# Patient Record
Sex: Female | Born: 1935
Health system: Southern US, Community
[De-identification: ages and names within clinical notes are randomized; demographics above are authoritative.]

---

## 1998-02-10 ENCOUNTER — Ambulatory Visit (HOSPITAL_COMMUNITY): Admission: RE | Admit: 1998-02-10 | Discharge: 1998-02-10 | Payer: Self-pay | Admitting: Cardiology

## 1998-05-27 ENCOUNTER — Other Ambulatory Visit: Admission: RE | Admit: 1998-05-27 | Discharge: 1998-05-27 | Payer: Self-pay | Admitting: Obstetrics and Gynecology

## 1999-05-27 ENCOUNTER — Encounter (INDEPENDENT_AMBULATORY_CARE_PROVIDER_SITE_OTHER): Payer: Self-pay | Admitting: Specialist

## 1999-05-27 ENCOUNTER — Ambulatory Visit (HOSPITAL_COMMUNITY): Admission: RE | Admit: 1999-05-27 | Discharge: 1999-05-27 | Payer: Self-pay | Admitting: Obstetrics and Gynecology

## 1999-09-22 ENCOUNTER — Encounter: Payer: Self-pay | Admitting: Obstetrics and Gynecology

## 1999-09-29 ENCOUNTER — Encounter (INDEPENDENT_AMBULATORY_CARE_PROVIDER_SITE_OTHER): Payer: Self-pay | Admitting: Specialist

## 1999-09-29 ENCOUNTER — Inpatient Hospital Stay (HOSPITAL_COMMUNITY): Admission: RE | Admit: 1999-09-29 | Discharge: 1999-09-30 | Payer: Self-pay

## 2002-01-07 ENCOUNTER — Ambulatory Visit (HOSPITAL_COMMUNITY): Admission: RE | Admit: 2002-01-07 | Discharge: 2002-01-07 | Payer: Self-pay | Admitting: Gastroenterology

## 2004-07-18 ENCOUNTER — Encounter: Admission: RE | Admit: 2004-07-18 | Discharge: 2004-07-18 | Payer: Self-pay | Admitting: Radiology

## 2004-08-03 ENCOUNTER — Encounter: Admission: RE | Admit: 2004-08-03 | Discharge: 2004-08-03 | Payer: Self-pay

## 2004-08-04 ENCOUNTER — Ambulatory Visit (HOSPITAL_COMMUNITY): Admission: RE | Admit: 2004-08-04 | Discharge: 2004-08-04 | Payer: Self-pay

## 2004-08-04 ENCOUNTER — Encounter (INDEPENDENT_AMBULATORY_CARE_PROVIDER_SITE_OTHER): Payer: Self-pay | Admitting: Specialist

## 2004-08-04 ENCOUNTER — Encounter (INDEPENDENT_AMBULATORY_CARE_PROVIDER_SITE_OTHER): Payer: Self-pay

## 2004-08-04 ENCOUNTER — Ambulatory Visit (HOSPITAL_BASED_OUTPATIENT_CLINIC_OR_DEPARTMENT_OTHER): Admission: RE | Admit: 2004-08-04 | Discharge: 2004-08-04 | Payer: Self-pay

## 2004-08-18 ENCOUNTER — Ambulatory Visit: Payer: Self-pay | Admitting: Oncology

## 2004-09-07 ENCOUNTER — Ambulatory Visit (HOSPITAL_COMMUNITY): Admission: RE | Admit: 2004-09-07 | Discharge: 2004-09-07 | Payer: Self-pay | Admitting: Oncology

## 2004-09-16 ENCOUNTER — Ambulatory Visit: Admission: RE | Admit: 2004-09-16 | Discharge: 2004-11-17 | Payer: Self-pay | Admitting: *Deleted

## 2004-11-26 ENCOUNTER — Ambulatory Visit: Payer: Self-pay | Admitting: Oncology

## 2005-02-24 ENCOUNTER — Ambulatory Visit: Payer: Self-pay | Admitting: Oncology

## 2005-04-29 ENCOUNTER — Ambulatory Visit: Payer: Self-pay | Admitting: Oncology

## 2005-06-28 ENCOUNTER — Ambulatory Visit: Payer: Self-pay | Admitting: Oncology

## 2005-07-07 ENCOUNTER — Ambulatory Visit (HOSPITAL_COMMUNITY): Admission: RE | Admit: 2005-07-07 | Discharge: 2005-07-07 | Payer: Self-pay | Admitting: Oncology

## 2005-11-16 ENCOUNTER — Ambulatory Visit: Payer: Self-pay | Admitting: Oncology

## 2005-11-18 LAB — CBC WITH DIFFERENTIAL/PLATELET
Basophils Absolute: 0 10*3/uL (ref 0.0–0.1)
EOS%: 2.6 % (ref 0.0–7.0)
Eosinophils Absolute: 0.2 10*3/uL (ref 0.0–0.5)
HCT: 37.8 % (ref 34.8–46.6)
HGB: 12.7 g/dL (ref 11.6–15.9)
MCH: 27.7 pg (ref 26.0–34.0)
MCV: 82.5 fL (ref 81.0–101.0)
MONO%: 7.9 % (ref 0.0–13.0)
NEUT#: 5.1 10*3/uL (ref 1.5–6.5)
NEUT%: 68.9 % (ref 39.6–76.8)
Platelets: 238 10*3/uL (ref 145–400)
RDW: 13.4 % (ref 11.3–14.5)

## 2005-11-18 LAB — COMPREHENSIVE METABOLIC PANEL
AST: 18 U/L (ref 0–37)
Alkaline Phosphatase: 64 U/L (ref 39–117)
BUN: 15 mg/dL (ref 6–23)
Glucose, Bld: 177 mg/dL — ABNORMAL HIGH (ref 70–99)
Sodium: 139 mEq/L (ref 135–145)
Total Bilirubin: 0.3 mg/dL (ref 0.3–1.2)

## 2005-11-18 LAB — LACTATE DEHYDROGENASE: LDH: 139 U/L (ref 94–250)

## 2006-04-07 ENCOUNTER — Emergency Department (HOSPITAL_COMMUNITY): Admission: EM | Admit: 2006-04-07 | Discharge: 2006-04-07 | Payer: Self-pay | Admitting: Emergency Medicine

## 2006-05-17 ENCOUNTER — Ambulatory Visit: Payer: Self-pay | Admitting: Oncology

## 2006-05-19 LAB — COMPREHENSIVE METABOLIC PANEL
ALT: 23 U/L (ref 0–35)
BUN: 16 mg/dL (ref 6–23)
CO2: 25 mEq/L (ref 19–32)
Calcium: 9.4 mg/dL (ref 8.4–10.5)
Chloride: 105 mEq/L (ref 96–112)
Creatinine, Ser: 0.7 mg/dL (ref 0.40–1.20)
Glucose, Bld: 116 mg/dL — ABNORMAL HIGH (ref 70–99)

## 2006-05-19 LAB — CBC WITH DIFFERENTIAL/PLATELET
BASO%: 0.3 % (ref 0.0–2.0)
Basophils Absolute: 0 10*3/uL (ref 0.0–0.1)
Eosinophils Absolute: 0.2 10*3/uL (ref 0.0–0.5)
HCT: 36.4 % (ref 34.8–46.6)
HGB: 12.7 g/dL (ref 11.6–15.9)
LYMPH%: 21.7 % (ref 14.0–48.0)
MONO#: 0.7 10*3/uL (ref 0.1–0.9)
NEUT%: 64.4 % (ref 39.6–76.8)
Platelets: 215 10*3/uL (ref 145–400)
WBC: 6.3 10*3/uL (ref 3.9–10.0)
lymph#: 1.4 10*3/uL (ref 0.9–3.3)

## 2006-05-19 LAB — LACTATE DEHYDROGENASE: LDH: 145 U/L (ref 94–250)

## 2006-07-04 ENCOUNTER — Ambulatory Visit: Payer: Self-pay | Admitting: Oncology

## 2006-07-14 ENCOUNTER — Encounter: Admission: RE | Admit: 2006-07-14 | Discharge: 2006-07-14 | Payer: Self-pay | Admitting: Oncology

## 2006-11-03 ENCOUNTER — Ambulatory Visit: Payer: Self-pay | Admitting: Oncology

## 2006-11-20 LAB — COMPREHENSIVE METABOLIC PANEL
Alkaline Phosphatase: 57 U/L (ref 39–117)
CO2: 22 mEq/L (ref 19–32)
Creatinine, Ser: 0.74 mg/dL (ref 0.40–1.20)
Glucose, Bld: 103 mg/dL — ABNORMAL HIGH (ref 70–99)
Total Bilirubin: 0.3 mg/dL (ref 0.3–1.2)

## 2006-11-20 LAB — CBC WITH DIFFERENTIAL/PLATELET
BASO%: 0.4 % (ref 0.0–2.0)
Eosinophils Absolute: 0.3 10*3/uL (ref 0.0–0.5)
HCT: 38.7 % (ref 34.8–46.6)
LYMPH%: 26.1 % (ref 14.0–48.0)
MCHC: 34.4 g/dL (ref 32.0–36.0)
MCV: 81.8 fL (ref 81.0–101.0)
MONO#: 0.6 10*3/uL (ref 0.1–0.9)
MONO%: 8 % (ref 0.0–13.0)
NEUT%: 62 % (ref 39.6–76.8)
Platelets: 286 10*3/uL (ref 145–400)
WBC: 7.9 10*3/uL (ref 3.9–10.0)

## 2006-11-20 LAB — LACTATE DEHYDROGENASE: LDH: 149 U/L (ref 94–250)

## 2007-07-02 ENCOUNTER — Ambulatory Visit: Payer: Self-pay | Admitting: Oncology

## 2007-07-04 LAB — CBC WITH DIFFERENTIAL/PLATELET
BASO%: 0.5 % (ref 0.0–2.0)
Eosinophils Absolute: 0.2 10*3/uL (ref 0.0–0.5)
LYMPH%: 22.2 % (ref 14.0–48.0)
MCHC: 34.3 g/dL (ref 32.0–36.0)
MCV: 81.1 fL (ref 81.0–101.0)
MONO%: 8.7 % (ref 0.0–13.0)
NEUT#: 4.1 10*3/uL (ref 1.5–6.5)
Platelets: 211 10*3/uL (ref 145–400)
RBC: 4.77 10*6/uL (ref 3.70–5.32)
RDW: 13.4 % (ref 11.3–14.5)
WBC: 6.3 10*3/uL (ref 3.9–10.0)

## 2007-07-05 LAB — COMPREHENSIVE METABOLIC PANEL
ALT: 29 U/L (ref 0–35)
AST: 25 U/L (ref 0–37)
Albumin: 4.5 g/dL (ref 3.5–5.2)
Alkaline Phosphatase: 45 U/L (ref 39–117)
Glucose, Bld: 135 mg/dL — ABNORMAL HIGH (ref 70–99)
Potassium: 4.1 mEq/L (ref 3.5–5.3)
Sodium: 139 mEq/L (ref 135–145)
Total Bilirubin: 0.4 mg/dL (ref 0.3–1.2)
Total Protein: 6.7 g/dL (ref 6.0–8.3)

## 2008-01-30 ENCOUNTER — Ambulatory Visit: Payer: Self-pay | Admitting: Oncology

## 2008-02-01 LAB — CBC WITH DIFFERENTIAL/PLATELET
Eosinophils Absolute: 0.2 10*3/uL (ref 0.0–0.5)
HCT: 37.3 % (ref 34.8–46.6)
HGB: 12.6 g/dL (ref 11.6–15.9)
LYMPH%: 22.5 % (ref 14.0–48.0)
MONO#: 0.5 10*3/uL (ref 0.1–0.9)
NEUT#: 4.8 10*3/uL (ref 1.5–6.5)
NEUT%: 67.9 % (ref 39.6–76.8)
Platelets: 153 10*3/uL (ref 145–400)
WBC: 7.1 10*3/uL (ref 3.9–10.0)
lymph#: 1.6 10*3/uL (ref 0.9–3.3)

## 2008-02-04 LAB — LACTATE DEHYDROGENASE: LDH: 130 U/L (ref 94–250)

## 2008-02-04 LAB — COMPREHENSIVE METABOLIC PANEL
CO2: 19 mEq/L (ref 19–32)
Calcium: 9 mg/dL (ref 8.4–10.5)
Chloride: 105 mEq/L (ref 96–112)
Creatinine, Ser: 0.75 mg/dL (ref 0.40–1.20)
Glucose, Bld: 188 mg/dL — ABNORMAL HIGH (ref 70–99)
Total Bilirubin: 0.3 mg/dL (ref 0.3–1.2)
Total Protein: 6.1 g/dL (ref 6.0–8.3)

## 2008-02-04 LAB — CANCER ANTIGEN 27.29: CA 27.29: 6 U/mL (ref 0–39)

## 2008-08-20 ENCOUNTER — Ambulatory Visit: Payer: Self-pay | Admitting: Oncology

## 2008-08-22 LAB — CBC WITH DIFFERENTIAL/PLATELET
BASO%: 0.4 % (ref 0.0–2.0)
EOS%: 2.4 % (ref 0.0–7.0)
HCT: 37.3 % (ref 34.8–46.6)
MCH: 28.7 pg (ref 25.1–34.0)
MCHC: 34.1 g/dL (ref 31.5–36.0)
MONO%: 7.8 % (ref 0.0–14.0)
NEUT%: 63.1 % (ref 38.4–76.8)
lymph#: 1.9 10*3/uL (ref 0.9–3.3)

## 2008-08-25 LAB — COMPREHENSIVE METABOLIC PANEL
ALT: 19 U/L (ref 0–35)
AST: 21 U/L (ref 0–37)
Alkaline Phosphatase: 41 U/L (ref 39–117)
Chloride: 106 mEq/L (ref 96–112)
Creatinine, Ser: 0.75 mg/dL (ref 0.40–1.20)
Total Bilirubin: 0.3 mg/dL (ref 0.3–1.2)

## 2009-02-04 ENCOUNTER — Ambulatory Visit: Payer: Self-pay | Admitting: Oncology

## 2009-02-06 LAB — COMPREHENSIVE METABOLIC PANEL
AST: 25 U/L (ref 0–37)
Albumin: 3.8 g/dL (ref 3.5–5.2)
BUN: 18 mg/dL (ref 6–23)
Calcium: 9.4 mg/dL (ref 8.4–10.5)
Chloride: 104 mEq/L (ref 96–112)
Glucose, Bld: 157 mg/dL — ABNORMAL HIGH (ref 70–99)
Potassium: 3.9 mEq/L (ref 3.5–5.3)

## 2009-02-06 LAB — CANCER ANTIGEN 27.29: CA 27.29: 8 U/mL (ref 0–39)

## 2009-02-06 LAB — CBC WITH DIFFERENTIAL/PLATELET
Basophils Absolute: 0.1 10*3/uL (ref 0.0–0.1)
Eosinophils Absolute: 0.3 10*3/uL (ref 0.0–0.5)
HCT: 42.3 % (ref 34.8–46.6)
HGB: 13.7 g/dL (ref 11.6–15.9)
MONO#: 0.6 10*3/uL (ref 0.1–0.9)
NEUT%: 57.6 % (ref 38.4–76.8)
WBC: 7.6 10*3/uL (ref 3.9–10.3)
lymph#: 2.3 10*3/uL (ref 0.9–3.3)

## 2009-02-06 LAB — VITAMIN D 25 HYDROXY (VIT D DEFICIENCY, FRACTURES): Vit D, 25-Hydroxy: 50 ng/mL (ref 30–89)

## 2009-08-11 ENCOUNTER — Ambulatory Visit: Payer: Self-pay | Admitting: Oncology

## 2009-08-13 LAB — CBC WITH DIFFERENTIAL/PLATELET
BASO%: 0.3 % (ref 0.0–2.0)
Basophils Absolute: 0 10*3/uL (ref 0.0–0.1)
EOS%: 3.2 % (ref 0.0–7.0)
HGB: 12.9 g/dL (ref 11.6–15.9)
MCH: 28 pg (ref 25.1–34.0)
MCHC: 34.1 g/dL (ref 31.5–36.0)
MCV: 82.1 fL (ref 79.5–101.0)
MONO%: 8.9 % (ref 0.0–14.0)
NEUT%: 61.8 % (ref 38.4–76.8)
RDW: 13.7 % (ref 11.2–14.5)

## 2009-08-13 LAB — COMPREHENSIVE METABOLIC PANEL
AST: 22 U/L (ref 0–37)
Alkaline Phosphatase: 52 U/L (ref 39–117)
BUN: 17 mg/dL (ref 6–23)
Creatinine, Ser: 0.67 mg/dL (ref 0.40–1.20)
Potassium: 4.2 mEq/L (ref 3.5–5.3)

## 2009-08-13 LAB — VITAMIN D 25 HYDROXY (VIT D DEFICIENCY, FRACTURES): Vit D, 25-Hydroxy: 54 ng/mL (ref 30–89)

## 2010-02-10 ENCOUNTER — Encounter (INDEPENDENT_AMBULATORY_CARE_PROVIDER_SITE_OTHER): Payer: MEDICARE | Admitting: Vascular Surgery

## 2010-02-10 ENCOUNTER — Encounter (INDEPENDENT_AMBULATORY_CARE_PROVIDER_SITE_OTHER): Payer: MEDICARE

## 2010-02-10 DIAGNOSIS — I8310 Varicose veins of unspecified lower extremity with inflammation: Secondary | ICD-10-CM

## 2010-02-10 DIAGNOSIS — I83893 Varicose veins of bilateral lower extremities with other complications: Secondary | ICD-10-CM

## 2010-02-15 NOTE — Consult Note (Signed)
NEW PATIENT CONSULTATION  Houston, Tracy NOLIE DOB:  Nov 07, 1935                                       02/10/2010 JYNWG#:[id number]  The patient presents today for evaluation of bilateral venous hypertension.  She is a 75 year old white female with progressive changes of varicosities over her medial right thigh, bilateral calves, with very prominent telangiectasia over both calves as well.  She reports that she does have back and orthopedic discomfort and that she is a very good at tolerating pain.  She does have some sensation of discomfort over the telangiectasia over her calves and occasionally a heavy feeling over the varicosities in her thigh.  She has not worn compression garments from this.  PAST MEDICAL HISTORY:  Significant for insulin-dependent diabetes, hypertension, elevated cholesterol.  PAST SURGICAL HISTORY:  Cholecystectomy, lumpectomy for breast cancer, hysterectomy.  SOCIAL HISTORY:  She is married with 2 children.  She does not smoke or drink alcohol.  FAMILY HISTORY:  Positive only for emphysema in her father and brother. No premature atherosclerotic disease.  REVIEW OF SYSTEMS:  No weight loss or gain.  She weighs 190 pounds.  She is 5 feet 2 inches tall.  She does have discomfort in her legs with walking. CARDIAC:  Negative. GI:  Difficulty swallowing. ENT:  Positive for glaucoma. MUSCULOSKELETAL:  Positive for arthritis, joint pain and muscle pain. Review of systems otherwise negative.  PHYSICAL EXAMINATION:  A well-developed, well-nourished white female appearing stated age, in no acute stress.  Blood pressure is 154/84, pulse 77, respirations 16.  HEENT:  Normal.  Extremities:  Her dorsalis pedis pulses are 2+ bilaterally.  Musculoskeletal:  Shows no major deformity or cyanosis.  Neurologic:  No focal weakness or  paresthesias. Skin:  Without ulcers or rashes.  She does have prominent varicosities over her medial thighs  bilaterally, more so on the right, and on her calfs and also marked telangiectasia.  She underwent noninvasive vascular laboratory study in our office, and this reveals some reflux in her deep system.  She does reflux throughout her great saphenous vein bilaterally with a great deal of branching patterns.  These do extend into the varicosities in her thighs.  I have discussed this at length with the patient.  I explained that this is not a dangerous situation and that we would recommend treatment only for significant pain or cosmetic concern.  She has no concern regarding the appearance of this and reports that the pain is tolerable.  I explained that if she should have progressive symptoms that she would be amenable to laser ablation for relief of her venous hypertension.  We will see her again on an as-needed basis, and she will notify us if she desires treatment.    Larina Earthly, M.D. Electronically Signed  TFE/MEDQ  D:  02/10/2010  T:  02/11/2010  Job:  [id number]  cc:   [name], M.D.

## 2010-02-17 NOTE — Procedures (Unsigned)
LOWER EXTREMITY VENOUS REFLUX EXAM  INDICATION:  Bilateral varicose veins with inflammation.  EXAM:  Using color-flow imaging and pulse Doppler spectral analysis, the bilateral common femoral, superficial femoral, popliteal, posterior tibial, greater and lesser saphenous veins are evaluated.  There is evidence suggesting deep venous insufficiency in the bilateral lower extremities.  The bilateral saphenofemoral junctions are not competent with Reflux of >518milliseconds. The bilateral GSV's are not competent with Reflux of >565milliseconds with the caliber as described below.  The bilateral proximal short saphenous veins demonstrate competency.  GSV Diameter (used if found to be incompetent only)                                           Right          Left Proximal Greater Saphenous Vein           0.80 cm        0.63 cm Proximal-to-mid-thigh                     0.44 cm        0.37 cm Mid thigh                                 0.47 cm        0.31 cm Mid-distal thigh                          cm             cm Distal thigh                              0.40 cm        0.32 cm Knee                                      0.43 cm        0.36 cm  IMPRESSION: 1. Bilateral greater saphenous veins are not competent. 2. The bilateral greater saphenous veins are not tortuous. 3. The deep venous system is not competent. 4. The bilateral short saphenous veins are competent.  ___________________________________________ Larina Earthly, M.D.  LT/MEDQ  D:  02/10/2010  T:  02/10/2010  Job:  [id number]

## 2010-05-21 NOTE — Discharge Summary (Signed)
[facility]  Patient:    Tracy Houston, Tracy Houston                MRN: [id number] Adm. Date:  [id number] Disc. Date: [id number] Attending:  Meredith Leeds CC:         Zigmund Daniel, M.D.   Discharge Summary  ADMISSION DIAGNOSES: 1. Persistent abnormal uterine bleeding. 2. Cholelithiasis.  DISCHARGE DIAGNOSES: 1. Persistent abnormal uterine bleeding. 2. Cholelithiasis.  OPERATION PERFORMED:  Laparoscopic cholecystectomy, laparoscopic-assisted vaginal hysterectomy, bilateral salpingo-oophorectomy.  BRIEF HISTORY:  The patient, [name], is a 75 year old female with persistent abnormal uterine bleeding despite numerous attempts at conservative measures to control her menses.  Admission hemoglobin was 13, and mean corpuscular volume was 81.  Coagulation profile was completely normal.  Admission cardiogram was normal as was chest x-ray.  HOSPITAL COURSE:  The patient was admitted to the hospital, underwent an uneventful laparoscopic cholecystectomy which revealed cholesterolosis, cholelithiasis, and laparoscopic-assisted hysterectomy with removal of 178 g uterus containing adenomyosis and small leiomyoma.  Both ovaries were normal. Her postoperative course was uncomplicated.  She was discharged the following day to home and office care.  She was asked to return to the office in six weeks.  CONDITION UPON DISCHARGE:  Improved. DD:  10/09/99 TD:  10/11/99 Job: [id number]

## 2010-05-21 NOTE — H&P (Signed)
[facility]  Patient:    Tracy Houston, PICA                      MRN: [id number] Attending:  Katherine Roan, M.D.                         History and Physical  CHIEF COMPLAINT:  Continued abnormal uterine bleeding.  HISTORY OF PRESENT ILLNESS:  Michaelle is a 75 year old gravida 4, para 2 female who is postmenopausal and has a history of three D&Cs for heavy periods and most recently, a hysteroscopy with endometrial ablation.  She continues to have abnormal uterine bleeding and is currently on Prempro.  MEDICATIONS:  She takes Dicyclomine 20 mg daily, calcium and vitamins.  She is on no other medication.  ALLERGIES:  She is allergic to CODEINE and questionably LATEX.  PAST SURGICAL HISTORY:  She has a history of a therapeutic abortion as well.  REVIEW OF SYSTEMS:  HEENT:  She wears glasses but no decrease in visual or auditory acuity.  No frequent headaches or dizziness.  HEART:  No history of hypertension or rheumatic fever.  No chest pain, no shortness of breath.  No history of mitral valve prolapse.  LUNGS:  No cough.  No history of asthma. No history of pneumonia.  GU:  No stress urinary incontinence.  No history of UTIs.  GASTROINTESTINAL:  No history of melena.  No weight loss.  No unexplained weight loss or gain.  No anorexia.  MUSCLE, BONES AND JOINTS:  She has no history of arthritis or fractures.  SOCIAL HISTORY:  She is self-employed.  FAMILY HISTORY:  Her mother died at 47.  Her father died at 59.  She has a first cousin with breast cancer and her mother had breast cancer.  She has two brothers and one sister.  There is a maternal grandmother who is diabetic and a maternal aunt with diabetes.  PHYSICAL EXAMINATION  VITAL SIGNS:  Examination reveals a weight of 204 pounds, a blood pressure of 140/100.  HEENT:  Examination is unremarkable.  Oropharynx is not injected.  NECK:  Supple.  Carotid pulses are equal bilaterally without bruits.   Trachea is in the midline.  No adenopathy appreciated.  BREASTS:  No masses or tenderness.  HEART:  Normal sinus rhythm.  No murmurs.  LUNGS:  Clear.  ABDOMEN:  Soft and flat.  Liver, spleen and kidney are not palpated.  Bowel sounds are normal.  PELVIC:  Normal vulva and vagina.  The cervix is clean.  Uterus appears to be retroverted on examination, without masses and seems to be slightly enlarged. Rectovaginal confirms.  EXTREMITIES:  Good range of motion and equal pulses and reflexes.  IMPRESSION:  Persistent abnormal uterine bleeding, status post multiple dilatations and curettages and hysteroscopy.  PLAN:  Laparoscopically assisted vaginal hysterectomy in conjunction with laparoscopic gallbladder surgery.  Risks, benefits and surgical procedures have been explained to patient in a detailed fashion. DD:  09/28/99 TD:  09/28/99 Job: [id number]

## 2010-05-21 NOTE — Op Note (Signed)
NAMESHAWNETTE, Tracy Houston             ACCOUNT NO.:  [id number]   MEDICAL RECORD NO.:  [id number]          PATIENT TYPE:  AMB   LOCATION:  DSC                          FACILITY:  [facility]   PHYSICIAN:  Lorre Munroe., M.D.DATE OF BIRTH:  08/11/1935   DATE OF PROCEDURE:  08/04/2004  DATE OF DISCHARGE:                                 OPERATIVE REPORT   PREOPERATIVE AND POSTOPERATIVE DIAGNOSIS:  Carcinoma of the right breast.   OPERATION:  Right partial mastectomy with sentinel lymph node biopsy.   SURGEON:  Lebron Conners, M.D.   ANESTHESIA:  General.   PROCEDURE:  After the patient was monitored and anesthetized, had routine  preparation and draping of the right breast and axilla; interrogated the  right axilla for increase in radioactivity and found a spot which was highly  radioactive. Prior to beginning the procedure, I had injected 4 mL of  Lymphazurin blue dye in the subareolar area.   I incised the axilla transversely and directed dissection toward the  increased radioactivity and found a slightly enlarged lymph node with high  radioactivity but no blue dye. After removing that, I found another  radioactive area and dissected it out as well finding both blue staining and  radioactivity. The radioactivity in the axilla, at that point, was the same  as background throughout. Hemostasis was not a problem. I packed that and  sent the nodes for touch prep examination; and Dr. Almyra Free called back that  there was no evidence of malignancy on the touch preps.   I made a transverse incision over the marked mass in the lower inner  quadrant of the right breast. I developed the skin and subcutaneous flaps  for several centimeters in all directions and pulled the localizing wire  back into the field. I then dissected down to the pectoral fascia; and since  the tumor was pretty deep, I removed the superficial part of the pectoral  fascia underneath the tumor and a good bit of  normal-appearing fat all the  way around it. After excising it, I used markers to orient it for margin  examination. I sent it for a radiograph and Dr. Isaiah Serge reported that it  looked good and that evidently all of the abnormality was removed. Touch  prep control is pending at this time. I got good hemostasis and closed the  skin with intracuticular 4-0 Vicryl and Steri-Strips. Sponge, needle and  instrument counts were correct.       WB/MEDQ  D:  08/04/2004  T:  08/05/2004  Job:  [id number]

## 2010-08-31 ENCOUNTER — Emergency Department (HOSPITAL_COMMUNITY): Payer: Medicare Other

## 2010-08-31 ENCOUNTER — Emergency Department (HOSPITAL_COMMUNITY)
Admission: EM | Admit: 2010-08-31 | Discharge: 2010-09-01 | Disposition: A | Discharge status: Home / Self Care | Payer: Medicare Other | Attending: Emergency Medicine | Admitting: Emergency Medicine

## 2010-08-31 DIAGNOSIS — Y92009 Unspecified place in unspecified non-institutional (private) residence as the place of occurrence of the external cause: Secondary | ICD-10-CM | POA: Insufficient documentation

## 2010-08-31 DIAGNOSIS — I10 Essential (primary) hypertension: Secondary | ICD-10-CM | POA: Insufficient documentation

## 2010-08-31 DIAGNOSIS — M79609 Pain in unspecified limb: Secondary | ICD-10-CM | POA: Insufficient documentation

## 2010-08-31 DIAGNOSIS — Z853 Personal history of malignant neoplasm of breast: Secondary | ICD-10-CM | POA: Insufficient documentation

## 2010-08-31 DIAGNOSIS — S43006A Unspecified dislocation of unspecified shoulder joint, initial encounter: Secondary | ICD-10-CM | POA: Insufficient documentation

## 2010-08-31 DIAGNOSIS — M542 Cervicalgia: Secondary | ICD-10-CM | POA: Insufficient documentation

## 2010-08-31 DIAGNOSIS — Z9010 Acquired absence of unspecified breast and nipple: Secondary | ICD-10-CM | POA: Insufficient documentation

## 2010-08-31 DIAGNOSIS — W108XXA Fall (on) (from) other stairs and steps, initial encounter: Secondary | ICD-10-CM | POA: Insufficient documentation

## 2010-08-31 DIAGNOSIS — M25519 Pain in unspecified shoulder: Secondary | ICD-10-CM | POA: Insufficient documentation

## 2010-09-01 ENCOUNTER — Emergency Department (HOSPITAL_COMMUNITY): Payer: Medicare Other

## 2010-09-14 ENCOUNTER — Encounter (HOSPITAL_BASED_OUTPATIENT_CLINIC_OR_DEPARTMENT_OTHER): Payer: Medicare Other | Admitting: Oncology

## 2010-09-14 ENCOUNTER — Other Ambulatory Visit: Payer: Self-pay | Admitting: Oncology

## 2010-09-14 DIAGNOSIS — Z170 Estrogen receptor positive status [ER+]: Secondary | ICD-10-CM

## 2010-09-14 DIAGNOSIS — C50919 Malignant neoplasm of unspecified site of unspecified female breast: Secondary | ICD-10-CM

## 2010-09-14 LAB — CBC WITH DIFFERENTIAL/PLATELET
Eosinophils Absolute: 0.3 10*3/uL (ref 0.0–0.5)
HCT: 37.2 % (ref 34.8–46.6)
LYMPH%: 18.1 % (ref 14.0–49.7)
MONO#: 0.8 10*3/uL (ref 0.1–0.9)
NEUT#: 6 10*3/uL (ref 1.5–6.5)
NEUT%: 69 % (ref 38.4–76.8)
Platelets: 232 10*3/uL (ref 145–400)
WBC: 8.7 10*3/uL (ref 3.9–10.3)

## 2010-09-15 LAB — COMPREHENSIVE METABOLIC PANEL
Albumin: 3.9 g/dL (ref 3.5–5.2)
Alkaline Phosphatase: 86 U/L (ref 39–117)
BUN: 18 mg/dL (ref 6–23)
Glucose, Bld: 169 mg/dL — ABNORMAL HIGH (ref 70–99)
Total Bilirubin: 0.4 mg/dL (ref 0.3–1.2)

## 2010-09-15 LAB — CANCER ANTIGEN 27.29: CA 27.29: 7 U/mL (ref 0–39)

## 2010-09-15 LAB — VITAMIN D 25 HYDROXY (VIT D DEFICIENCY, FRACTURES): Vit D, 25-Hydroxy: 72 ng/mL (ref 30–89)

## 2011-07-19 ENCOUNTER — Encounter: Payer: Self-pay | Admitting: Emergency Medicine

## 2011-07-19 DIAGNOSIS — C50919 Malignant neoplasm of unspecified site of unspecified female breast: Secondary | ICD-10-CM

## 2011-07-19 DIAGNOSIS — E559 Vitamin D deficiency, unspecified: Secondary | ICD-10-CM

## 2011-07-20 ENCOUNTER — Telehealth: Payer: Self-pay | Admitting: *Deleted

## 2011-07-20 NOTE — Telephone Encounter (Signed)
patient confirmed over the phone the new date and time 

## 2011-09-02 IMAGING — CR DG SHOULDER 2+V*R*
3 series · 3 of 3 positions shown · non-contrast
Comparison: None.

CLINICAL DATA: Shoulder pain status post fall.

RIGHT SHOULDER - 2+ VIEW

[w shoulder ap internal right *]
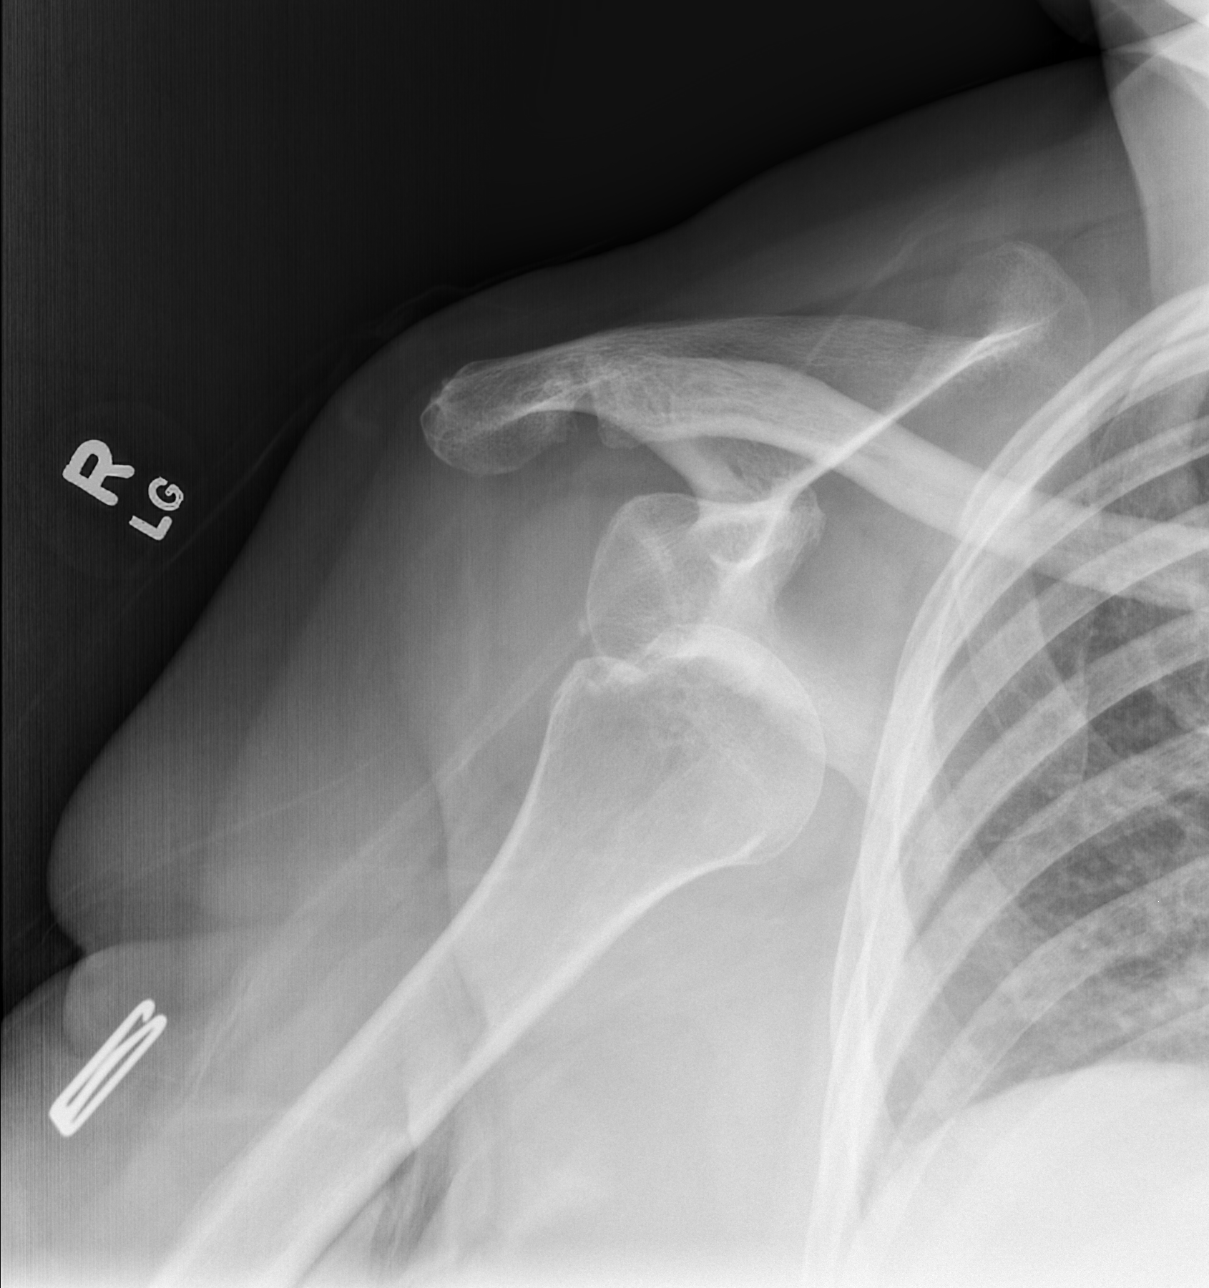

[w shoulder ap external right *]
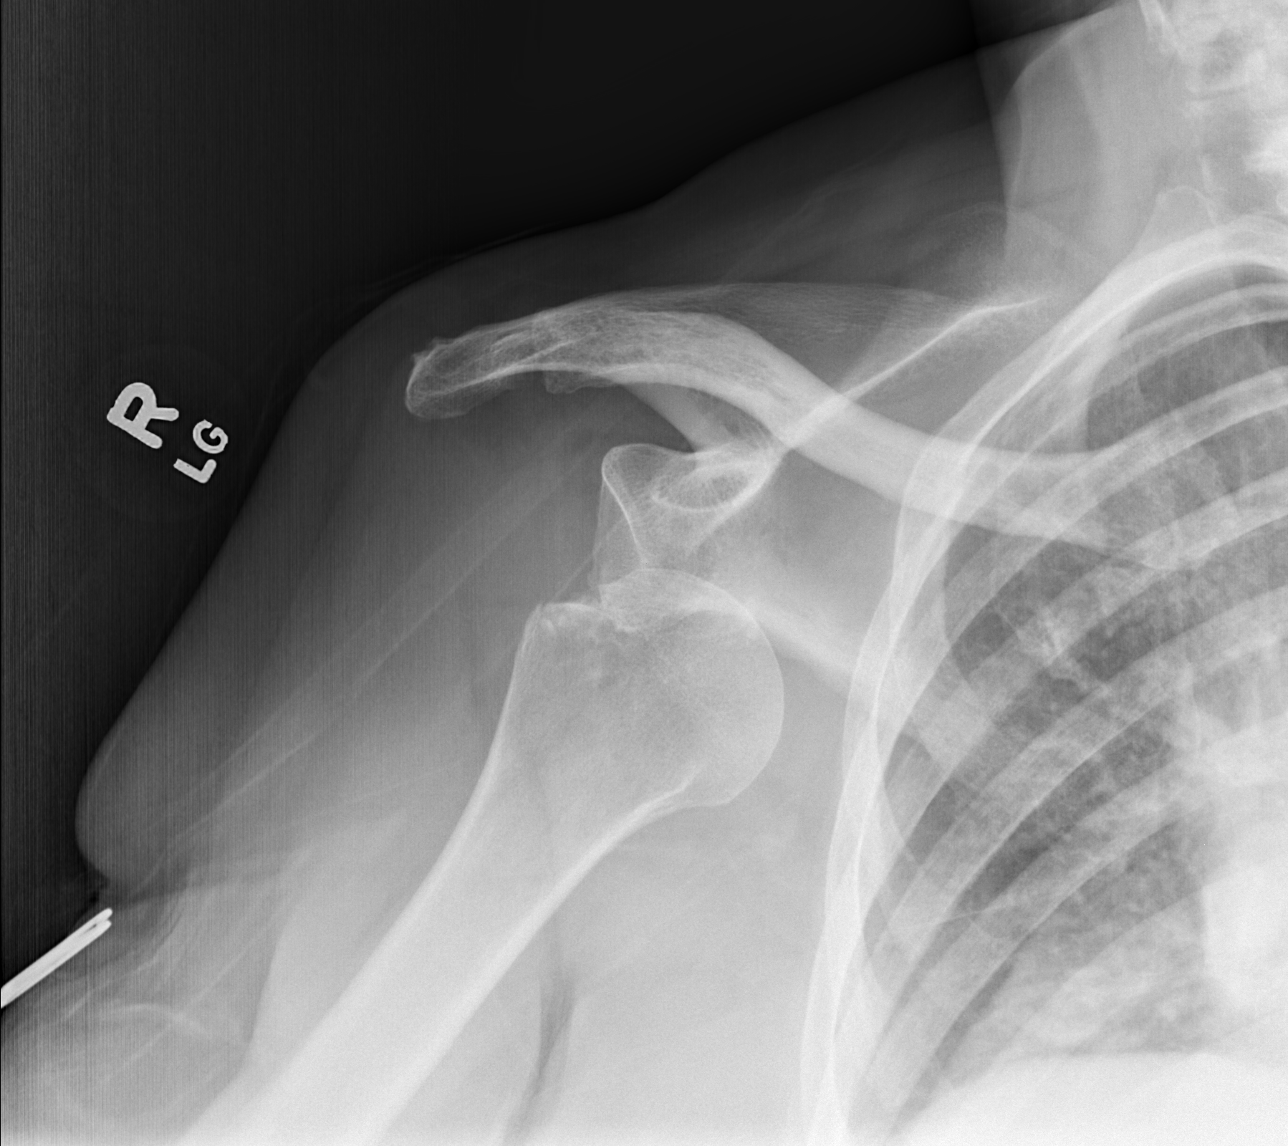

[w shoulder y view right *]
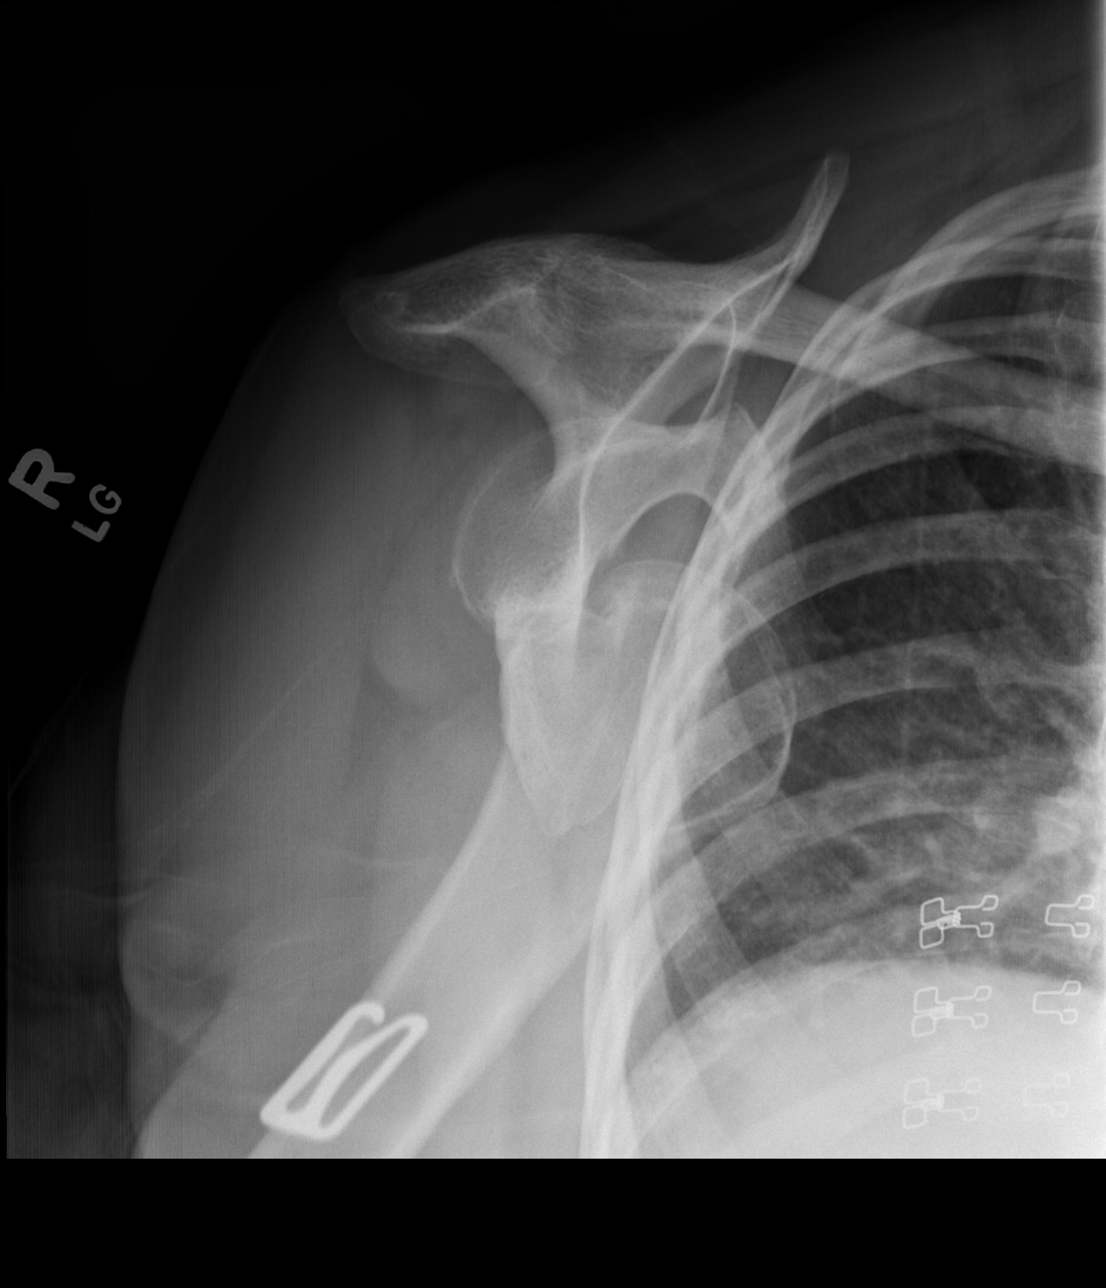

[3 of 3 positions shown; findings below may reference images not displayed]

FINDINGS: The right humerus is displaced anteromedially and
inferiorly, abutting the glenoid surface.  There is a Hill-Sachs
deformity.  No displaced glenoid fracture identified on these
views.
IMPRESSION: Right shoulder dislocation with Hill-Sachs fracture deformity.

## 2011-09-03 IMAGING — CR DG FOOT COMPLETE 3+V*L*
3 series · 3 of 3 positions shown · non-contrast
Comparison: None.

CLINICAL DATA: Left foot pain status post fall.

LEFT FOOT - COMPLETE 3+ VIEW

[view not recorded (1 of 3)]
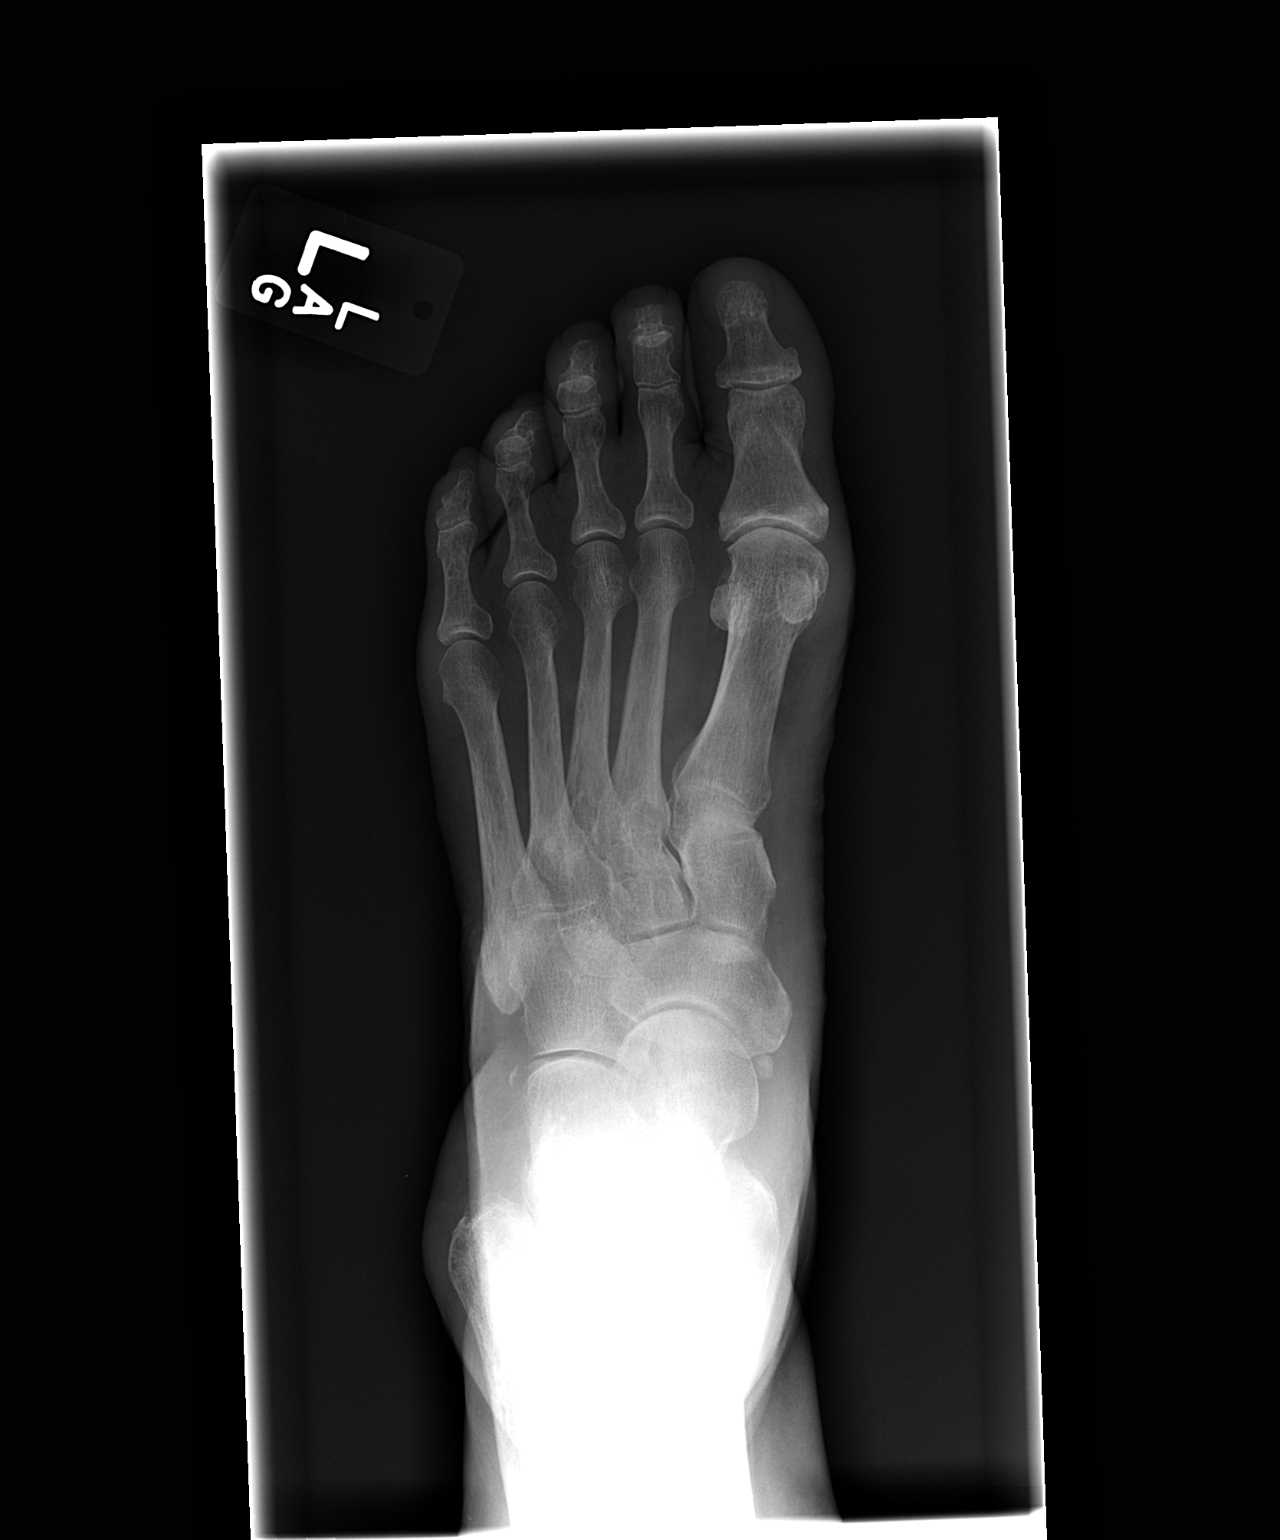

[view not recorded (2 of 3)]
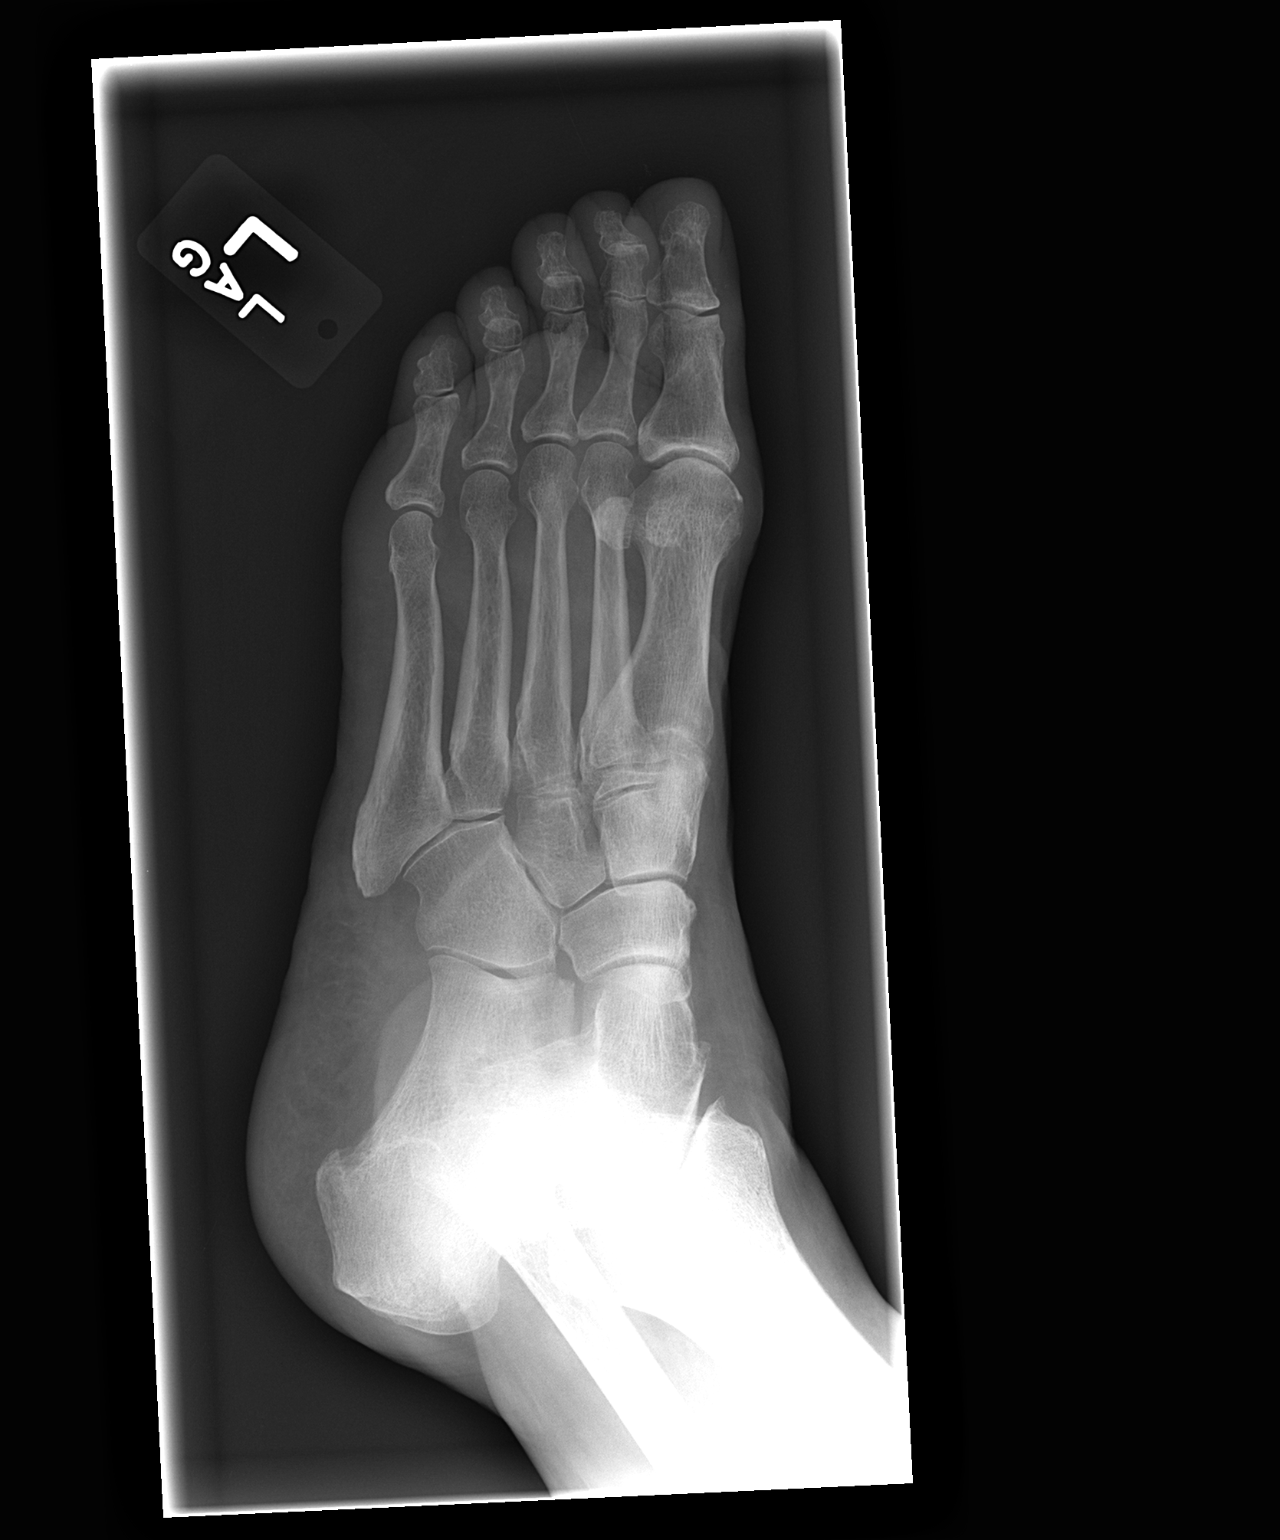

[view not recorded (3 of 3)]
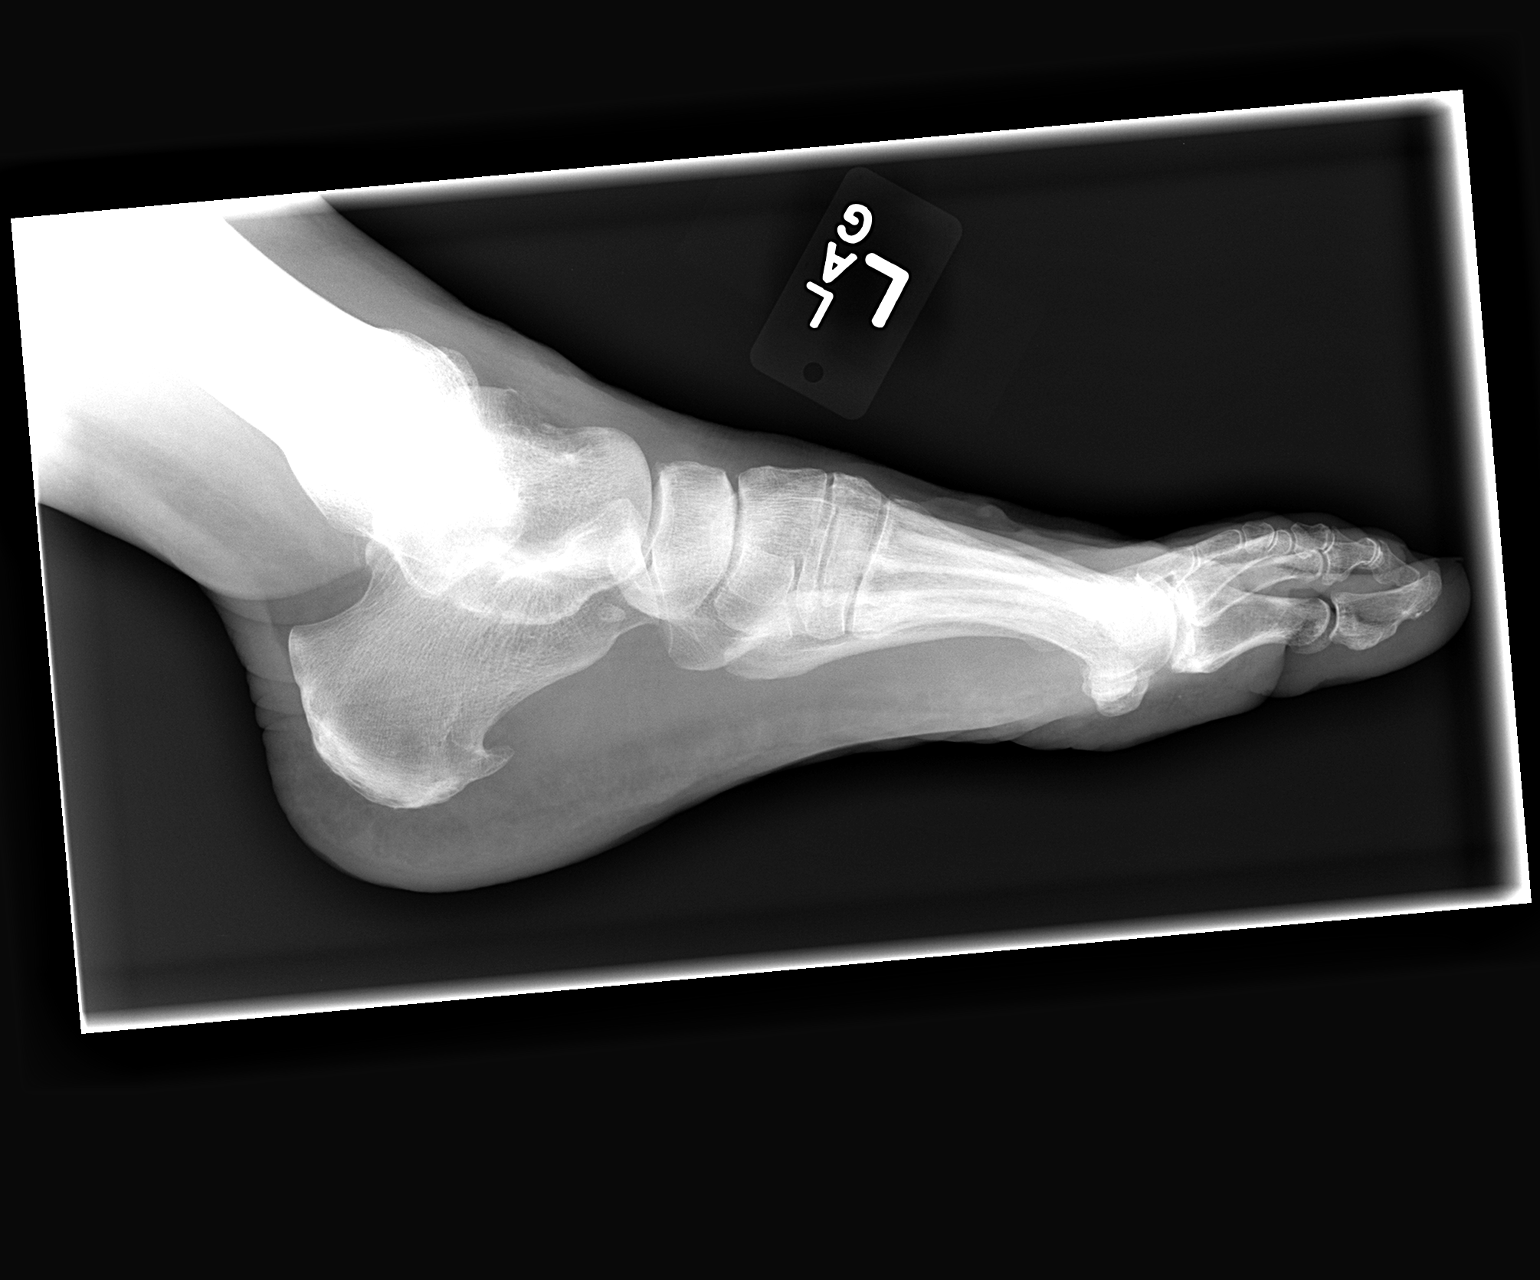

[3 of 3 positions shown; findings below may reference images not displayed]

FINDINGS: Curvilinear calcific density along the lateral margin of
the calcaneus, near the calcaneocuboid articulation.  No other
fracture or dislocation.  Plantar calcaneal enthesiophyte.
IMPRESSION: Curvilinear calcific density along the lateral margin of the
calcaneus, near the calcaneocuboid articulation, may represent a
small avulsed fragment.  Correlate with point tenderness.

## 2011-09-22 ENCOUNTER — Other Ambulatory Visit (HOSPITAL_BASED_OUTPATIENT_CLINIC_OR_DEPARTMENT_OTHER): Payer: Medicare Other | Admitting: Lab

## 2011-09-22 DIAGNOSIS — C50919 Malignant neoplasm of unspecified site of unspecified female breast: Secondary | ICD-10-CM

## 2011-09-22 DIAGNOSIS — E559 Vitamin D deficiency, unspecified: Secondary | ICD-10-CM

## 2011-09-22 LAB — CBC WITH DIFFERENTIAL/PLATELET
Basophils Absolute: 0 10*3/uL (ref 0.0–0.1)
Eosinophils Absolute: 0.3 10*3/uL (ref 0.0–0.5)
HGB: 13.2 g/dL (ref 11.6–15.9)
MCV: 83 fL (ref 79.5–101.0)
MONO#: 0.8 10*3/uL (ref 0.1–0.9)
NEUT#: 5.8 10*3/uL (ref 1.5–6.5)
RDW: 15 % — ABNORMAL HIGH (ref 11.2–14.5)
WBC: 8.3 10*3/uL (ref 3.9–10.3)
lymph#: 1.5 10*3/uL (ref 0.9–3.3)

## 2011-09-22 LAB — COMPREHENSIVE METABOLIC PANEL (CC13)
CO2: 21 mEq/L — ABNORMAL LOW (ref 22–29)
Calcium: 9.7 mg/dL (ref 8.4–10.4)
Chloride: 107 mEq/L (ref 98–107)
Creatinine: 0.8 mg/dL (ref 0.6–1.1)
Glucose: 122 mg/dl — ABNORMAL HIGH (ref 70–99)
Sodium: 139 mEq/L (ref 136–145)
Total Bilirubin: 0.4 mg/dL (ref 0.20–1.20)
Total Protein: 6.5 g/dL (ref 6.4–8.3)

## 2011-09-23 LAB — VITAMIN D 25 HYDROXY (VIT D DEFICIENCY, FRACTURES): Vit D, 25-Hydroxy: 63 ng/mL (ref 30–89)

## 2011-09-23 LAB — CANCER ANTIGEN 27.29: CA 27.29: 10 U/mL (ref 0–39)

## 2011-09-29 ENCOUNTER — Ambulatory Visit: Payer: Medicare Other | Admitting: Oncology

## 2011-10-03 ENCOUNTER — Ambulatory Visit (HOSPITAL_BASED_OUTPATIENT_CLINIC_OR_DEPARTMENT_OTHER): Payer: Medicare Other | Admitting: Oncology

## 2011-10-03 ENCOUNTER — Other Ambulatory Visit: Payer: Self-pay | Admitting: *Deleted

## 2011-10-03 VITALS — BP 124/74 | HR 64 | Temp 98.1°F | Resp 20 | Ht 63.0 in | Wt 191.1 lb

## 2011-10-03 DIAGNOSIS — E559 Vitamin D deficiency, unspecified: Secondary | ICD-10-CM

## 2011-10-03 DIAGNOSIS — C50919 Malignant neoplasm of unspecified site of unspecified female breast: Secondary | ICD-10-CM

## 2011-10-03 MED ORDER — AZITHROMYCIN 250 MG PO TABS: ORAL_TABLET | ORAL | Status: AC

## 2012-01-01 NOTE — Progress Notes (Signed)
Hematology and Oncology Follow Up Visit  Tracy Houston [id number] 1935/12/07 76 y.o. 10/03/11 Primary care   DIAGNOSIS:   T1b NO er/pr+ breast cancer s/p lumpectomy 08/04/04 followed by xrt and 5 yrs of AI therapy.    PAST THERAPY:  As above  Interim History:  Pt returns for f/u with her husband. She has not had any intercurrent illness. 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 She has had her routine mammography .  Medications: I have reviewed the patient's current medications.  Allergies: Allergies no known allergies  Past Medical History, Surgical history, Social history, and Family History were reviewed and updated.  Review of Systems: Constitutional:  Negative for fever, chills, night sweats, anorexia, weight loss, pain. Cardiovascular: no chest pain or dyspnea on exertion Respiratory: no cough, shortness of breath, or wheezing Neurological: no TIA or stroke symptoms Dermatological: negative ENT: negative Skin Gastrointestinal: no abdominal pain, change in bowel habits, or black or bloody stools Genito-Urinary: no dysuria, trouble voiding, or hematuria Hematological and Lymphatic: negative Breast: negative for breast lumps Musculoskeletal: negative Remaining ROS negative.  Physical Exam:  Blood pressure 124/74, pulse 64, temperature 98.1 F (36.7 C), temperature source Oral, resp. rate 20, height 5\' 3"  (1.6 m), weight 191 lb 1.6 oz (86.682 kg).  ECOG: 0  HEENT:  Sclerae anicteric, conjunctivae pink.  Oropharynx clear.  No mucositis or candidiasis.  Nodes:  No cervical, supraclavicular, or axillary lymphadenopathy palpated.  Breast Exam:  Right breast is benign.  No masses, discharge, skin change, or nipple inversion.  Left breast is benign.  No masses, discharge, skin change, or nipple inversion..  Lungs:  Clear to auscultation bilaterally.  No crackles, rhonchi, or wheezes.  Heart:  Regular rate and rhythm.  Abdomen:  Soft, nontender.  Positive  bowel sounds.  No organomegaly or masses palpated.  Musculoskeletal:  No focal spinal tenderness to palpation.  Extremities:  Benign.  No peripheral edema or cyanosis.  Skin:  Benign.  Neuro:  Nonfocal.     Lab Results: Lab Results  Component Value Date   WBC 8.3 09/22/2011   HGB 13.2 09/22/2011   HCT 40.3 09/22/2011   MCV 83.0 09/22/2011   PLT 200 09/22/2011     Chemistry      Component Value Date/Time   NA 139 09/22/2011 1013   NA 141 09/14/2010 1022   NA 141 09/14/2010 1022   K 4.2 09/22/2011 1013   K 3.9 09/14/2010 1022   K 3.9 09/14/2010 1022   CL 107 09/22/2011 1013   CL 106 09/14/2010 1022   CL 106 09/14/2010 1022   CO2 21* 09/22/2011 1013   CO2 24 09/14/2010 1022   CO2 24 09/14/2010 1022   BUN 19.0 09/22/2011 1013   BUN 18 09/14/2010 1022   BUN 18 09/14/2010 1022   CREATININE 0.8 09/22/2011 1013   CREATININE 0.68 09/14/2010 1022   CREATININE 0.68 09/14/2010 1022      Component Value Date/Time   CALCIUM 9.7 09/22/2011 1013   CALCIUM 8.9 09/14/2010 1022   CALCIUM 8.9 09/14/2010 1022   ALKPHOS 77 09/22/2011 1013   ALKPHOS 86 09/14/2010 1022   ALKPHOS 86 09/14/2010 1022   AST 20 09/22/2011 1013   AST 17 09/14/2010 1022   AST 17 09/14/2010 1022   ALT 19 09/22/2011 1013   ALT 12 09/14/2010 1022   ALT 12 09/14/2010 1022   BILITOT 0.40 09/22/2011 1013   BILITOT 0.4 09/14/2010 1022   BILITOT 0.4 09/14/2010 1022  Radiological Studies:  No results found.   IMPRESSIONS AND PLAN: A 76 y.o. female with   With hx of node negative breast cancer, s/p xrt and 5 yr of adjuvant hormonal therapy. She will be seen on annual basis .  Spent more than half the time coordinating care, as well as discussion of BMI and its implications.      [name] Cell [id number]

## 2012-03-24 ENCOUNTER — Encounter: Payer: Self-pay | Admitting: Oncology

## 2012-03-24 ENCOUNTER — Telehealth: Payer: Self-pay | Admitting: Oncology

## 2012-03-24 NOTE — Telephone Encounter (Signed)
s.w. pt and advised on Sept and Oct appt...pt aware of new Dr. .Marland KitchenMarland KitchenWelton Flakes....former pt of Dr Donnie Coffin..R.s to Dr. Welton Flakes

## 2012-04-02 ENCOUNTER — Telehealth: Payer: Self-pay | Admitting: Oncology

## 2012-10-02 ENCOUNTER — Other Ambulatory Visit: Payer: Medicare Other | Admitting: Lab

## 2012-10-02 ENCOUNTER — Other Ambulatory Visit: Payer: Self-pay | Admitting: Emergency Medicine

## 2012-10-02 DIAGNOSIS — C50919 Malignant neoplasm of unspecified site of unspecified female breast: Secondary | ICD-10-CM

## 2012-10-02 LAB — COMPREHENSIVE METABOLIC PANEL (CC13)
ALT: 15 U/L (ref 0–55)
AST: 18 U/L (ref 5–34)
BUN: 14.2 mg/dL (ref 7.0–26.0)
Calcium: 9.6 mg/dL (ref 8.4–10.4)
Chloride: 107 mEq/L (ref 98–109)
Creatinine: 0.7 mg/dL (ref 0.6–1.1)
Total Bilirubin: 0.43 mg/dL (ref 0.20–1.20)

## 2012-10-02 LAB — CBC WITH DIFFERENTIAL/PLATELET
BASO%: 0.6 % (ref 0.0–2.0)
EOS%: 4.3 % (ref 0.0–7.0)
HCT: 40.8 % (ref 34.8–46.6)
LYMPH%: 20.3 % (ref 14.0–49.7)
MCH: 26.5 pg (ref 25.1–34.0)
MCHC: 32.8 g/dL (ref 31.5–36.0)
MCV: 80.7 fL (ref 79.5–101.0)
MONO#: 0.8 10*3/uL (ref 0.1–0.9)
MONO%: 10.2 % (ref 0.0–14.0)
NEUT%: 64.6 % (ref 38.4–76.8)
Platelets: 243 10*3/uL (ref 145–400)
RBC: 5.06 10*6/uL (ref 3.70–5.45)
WBC: 7.9 10*3/uL (ref 3.9–10.3)

## 2012-10-03 LAB — VITAMIN D 25 HYDROXY (VIT D DEFICIENCY, FRACTURES): Vit D, 25-Hydroxy: 78 ng/mL (ref 30–89)

## 2012-10-09 ENCOUNTER — Ambulatory Visit (HOSPITAL_BASED_OUTPATIENT_CLINIC_OR_DEPARTMENT_OTHER): Payer: Medicare Other | Admitting: Adult Health

## 2012-10-09 ENCOUNTER — Ambulatory Visit: Payer: Medicare Other | Admitting: Oncology

## 2012-10-09 VITALS — BP 142/78 | HR 64 | Temp 97.6°F | Resp 20 | Ht 63.0 in | Wt 187.7 lb

## 2012-10-09 DIAGNOSIS — C50919 Malignant neoplasm of unspecified site of unspecified female breast: Secondary | ICD-10-CM

## 2012-10-09 DIAGNOSIS — C50911 Malignant neoplasm of unspecified site of right female breast: Secondary | ICD-10-CM

## 2012-10-09 DIAGNOSIS — Z170 Estrogen receptor positive status [ER+]: Secondary | ICD-10-CM

## 2012-10-09 NOTE — Progress Notes (Signed)
OFFICE PROGRESS NOTE  CC**  Tracy Baars, MD [address] [facility], [place]. 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    PRIOR THERAPY: 1.  Patient had a routine mammogram in July 2006 that showed an abnormality in the right breast.  It was biopsied and pathology demonstrated ER/PR positive breast cancer.    2.  She underwent lumpectomy on 08/04/2004 and a grade 1 T1N0 tumor was removed.  She went on to receive radiation therapy under the care of Dr. Dorna Bloom and then took a full 5 years of Tamoxifen anti-estrogen therapy.    CURRENT THERAPY: Observation  INTERVAL HISTORY: Tracy Houston 77 y.o. female returns for follow up and evaluation of her h/o right breast cancer.  She is doing well today.  She denies fevers, chills, unintentional weight loss, night sweats, new pain, or any further concerns. We reviewed her health maintenance below.  MEDICAL HISTORY:No past medical history on file.  ALLERGIES:  has No Known Allergies.  MEDICATIONS:  Current Outpatient Prescriptions  Medication Sig Dispense Refill  . amLODipine (NORVASC) 5 MG tablet Take 5 mg by mouth daily.      Marland Kitchen aspirin 81 MG tablet Take 81 mg by mouth daily.      . benazepril (LOTENSIN) 20 MG tablet Take 20 mg by mouth daily.      . calcium elemental as carbonate (BARIATRIC TUMS ULTRA) 400 MG tablet Chew 1,000 mg by mouth 2 (two) times daily.      . Cholecalciferol (VITAMIN D3) 1000 UNITS CAPS Take 1 capsule by mouth daily.      . Cinnamon 500 MG capsule Take 1,000 mg by mouth daily.      . dorzolamide-timolol (COSOPT) 22.3-6.8 MG/ML ophthalmic solution Place 1 drop into the left eye 2 (two) times daily.      . fish oil-omega-3 fatty acids 1000 MG capsule Take 2 g by mouth daily.      . Flaxseed, Linseed, (FLAXSEED OIL PO) Take 1 tablet by mouth daily.      Marland Kitchen glucosamine-chondroitin 500-400 MG tablet Take 1 tablet by  mouth daily.      Marland Kitchen latanoprost (XALATAN) 0.005 % ophthalmic solution Place 1 drop into both eyes at bedtime.      . metFORMIN (GLUCOPHAGE) 500 MG tablet Take 500 mg by mouth 2 (two) times daily with a meal.      . naproxen sodium (ANAPROX) 220 MG tablet Take 220 mg by mouth 2 (two) times daily with a meal.      . omeprazole (PRILOSEC) 20 MG capsule Take 20 mg by mouth daily.      . simvastatin (ZOCOR) 10 MG tablet Take 10 mg by mouth at bedtime.      . vitamin B-12 (CYANOCOBALAMIN) 100 MCG tablet Take 50 mcg by mouth daily.      Marland Kitchen azithromycin (ZITHROMAX) 250 MG tablet Take as directed  6 each  0  . co-enzyme Q-10 30 MG capsule Take 30 mg by mouth 3 (three) times daily.       No current facility-administered medications for this visit.    SURGICAL HISTORY: No past surgical history on file.  REVIEW OF SYSTEMS:  A 10 point review of systems was conducted and is otherwise negative except for what is noted above.     HEALTH MAINTENANCE:  Mammogram 09/13/12 Colonoscopy 2-3 years ago, 5 year follow up recommended Bone Density Due Pap  Smear stopped, 10 years ago Eye Exam Due 11/2012, appointments every 6 months Vitamin D 10/02/12 78 Lipid Panel 2014  PHYSICAL EXAMINATION: Blood pressure 142/78, pulse 64, temperature 97.6 F (36.4 C), temperature source Oral, resp. rate 20, height 5\' 3"  (1.6 m), weight 187 lb 11.2 oz (85.14 kg). Body mass index is 33.26 kg/(m^2). General: Patient is a well appearing female in no acute distress HEENT: PERRLA, sclerae anicteric no conjunctival pallor, MMM Neck: supple, no palpable adenopathy Lungs: clear to auscultation bilaterally, no wheezes, rhonchi, or rales Cardiovascular: regular rate rhythm, S1, S2, no murmurs, rubs or gallops Abdomen: Soft, non-tender, non-distended, normoactive bowel sounds, no HSM Extremities: warm and well perfused, no clubbing, cyanosis, or edema Skin: No rashes or lesions Neuro: Non-focal Breasts: right breast lumpectomy  site well healed no nodularity, masses, skin changes, left breast no nodularity, masses, skin changes.   ECOG PERFORMANCE STATUS: 0 - Asymptomatic      LABORATORY DATA: Lab Results  Component Value Date   WBC 7.9 10/02/2012   HGB 13.4 10/02/2012   HCT 40.8 10/02/2012   MCV 80.7 10/02/2012   PLT 243 10/02/2012      Chemistry      Component Value Date/Time   NA 141 10/02/2012 0932   NA 141 09/14/2010 1022   K 4.1 10/02/2012 0932   K 3.9 09/14/2010 1022   CL 107 09/22/2011 1013   CL 106 09/14/2010 1022   CO2 24 10/02/2012 0932   CO2 24 09/14/2010 1022   BUN 14.2 10/02/2012 0932   BUN 18 09/14/2010 1022   CREATININE 0.7 10/02/2012 0932   CREATININE 0.68 09/14/2010 1022      Component Value Date/Time   CALCIUM 9.6 10/02/2012 0932   CALCIUM 8.9 09/14/2010 1022   ALKPHOS 76 10/02/2012 0932   ALKPHOS 86 09/14/2010 1022   AST 18 10/02/2012 0932   AST 17 09/14/2010 1022   ALT 15 10/02/2012 0932   ALT 12 09/14/2010 1022   BILITOT 0.43 10/02/2012 0932   BILITOT 0.4 09/14/2010 1022       RADIOGRAPHIC STUDIES:  No results found.  ASSESSMENT:  Patient is a 77 year old female with history of stage IA ER/PR positive breast cancer.  She has underwent lumpectomy followed by radiation therapy, and 5 years of anti-estrogen therapy.  Her health maintenance is up to date, with the exception of a bone density which I recommended.   PLAN:  1.  Patient is doing well.  No sign of recurrence.  She and I discussed survivorship and keeping her health maintenance current.    2.  Patient will return to [place] on an as needed basis.  She will continue to see her PCP and have regular screenings, particularly her bone density.     All questions were answered. The patient knows to call the clinic with any problems, questions or concerns. We can certainly see the patient much sooner if necessary.  I spent 60 minutes counseling the patient face to face. The total time spent in the appointment was 60 minutes   Heinrich Fertig C.  Lyn Hollingshead, NP Medical Oncology [facility] Phone: [phone]   10/11/2012, 8:32 AM  ATTENDING'S ATTESTATION:  I personally reviewed patient's chart, examined patient myself, formulated the treatment plan as followed.   This is a former patient of Dr. Pierce Crane with history of stage I ER positive breast cancer. Overall she's doing well no evidence of recurrent disease. We spent extensive time, over her history and her treatment  in the past administered by Dr. Donnie Coffin. We discussed survivorship. At this time recommendation is for patient to continue to see her PCP get regular screenings especially bone density. And I will plan on seeing her back on it as needed basis.Drue Second, MD Medical/Oncology [facility] [phone] (beeper) [phone] (Office)

## 2012-10-11 ENCOUNTER — Encounter: Payer: Self-pay | Admitting: Adult Health

## 2012-10-11 DIAGNOSIS — C50911 Malignant neoplasm of unspecified site of right female breast: Secondary | ICD-10-CM | POA: Insufficient documentation

## 2013-11-15 DIAGNOSIS — E785 Hyperlipidemia, unspecified: Secondary | ICD-10-CM | POA: Diagnosis not present

## 2013-11-15 DIAGNOSIS — I739 Peripheral vascular disease, unspecified: Secondary | ICD-10-CM | POA: Diagnosis not present

## 2013-11-15 DIAGNOSIS — E1129 Type 2 diabetes mellitus with other diabetic kidney complication: Secondary | ICD-10-CM | POA: Diagnosis not present

## 2013-11-15 DIAGNOSIS — R809 Proteinuria, unspecified: Secondary | ICD-10-CM | POA: Diagnosis not present

## 2013-11-15 DIAGNOSIS — Z1389 Encounter for screening for other disorder: Secondary | ICD-10-CM | POA: Diagnosis not present

## 2013-11-15 DIAGNOSIS — G629 Polyneuropathy, unspecified: Secondary | ICD-10-CM | POA: Diagnosis not present

## 2013-11-15 DIAGNOSIS — I10 Essential (primary) hypertension: Secondary | ICD-10-CM | POA: Diagnosis not present

## 2013-11-15 DIAGNOSIS — E1140 Type 2 diabetes mellitus with diabetic neuropathy, unspecified: Secondary | ICD-10-CM | POA: Diagnosis not present

## 2013-12-05 DIAGNOSIS — I739 Peripheral vascular disease, unspecified: Secondary | ICD-10-CM | POA: Diagnosis not present

## 2014-05-06 DIAGNOSIS — H4011X3 Primary open-angle glaucoma, severe stage: Secondary | ICD-10-CM | POA: Diagnosis not present

## 2014-05-06 DIAGNOSIS — E669 Obesity, unspecified: Secondary | ICD-10-CM | POA: Diagnosis not present

## 2014-05-06 DIAGNOSIS — Z6829 Body mass index (BMI) 29.0-29.9, adult: Secondary | ICD-10-CM | POA: Diagnosis not present

## 2014-05-06 DIAGNOSIS — I10 Essential (primary) hypertension: Secondary | ICD-10-CM | POA: Diagnosis not present

## 2014-05-06 DIAGNOSIS — E1140 Type 2 diabetes mellitus with diabetic neuropathy, unspecified: Secondary | ICD-10-CM | POA: Diagnosis not present

## 2014-05-06 DIAGNOSIS — H25813 Combined forms of age-related cataract, bilateral: Secondary | ICD-10-CM | POA: Diagnosis not present

## 2014-05-06 DIAGNOSIS — H40011 Open angle with borderline findings, low risk, right eye: Secondary | ICD-10-CM | POA: Diagnosis not present

## 2014-05-06 DIAGNOSIS — E1129 Type 2 diabetes mellitus with other diabetic kidney complication: Secondary | ICD-10-CM | POA: Diagnosis not present

## 2014-05-06 DIAGNOSIS — R809 Proteinuria, unspecified: Secondary | ICD-10-CM | POA: Diagnosis not present

## 2014-05-06 DIAGNOSIS — E119 Type 2 diabetes mellitus without complications: Secondary | ICD-10-CM | POA: Diagnosis not present

## 2014-05-06 DIAGNOSIS — E785 Hyperlipidemia, unspecified: Secondary | ICD-10-CM | POA: Diagnosis not present

## 2014-05-06 DIAGNOSIS — G629 Polyneuropathy, unspecified: Secondary | ICD-10-CM | POA: Diagnosis not present

## 2014-05-26 DIAGNOSIS — H25812 Combined forms of age-related cataract, left eye: Secondary | ICD-10-CM | POA: Diagnosis not present

## 2014-05-26 DIAGNOSIS — H2512 Age-related nuclear cataract, left eye: Secondary | ICD-10-CM | POA: Diagnosis not present

## 2014-06-20 DIAGNOSIS — H2511 Age-related nuclear cataract, right eye: Secondary | ICD-10-CM | POA: Diagnosis not present

## 2014-06-30 DIAGNOSIS — H25811 Combined forms of age-related cataract, right eye: Secondary | ICD-10-CM | POA: Diagnosis not present

## 2014-06-30 DIAGNOSIS — H2511 Age-related nuclear cataract, right eye: Secondary | ICD-10-CM | POA: Diagnosis not present

## 2014-07-15 ENCOUNTER — Encounter: Payer: Self-pay | Admitting: Genetic Counselor

## 2014-08-29 DIAGNOSIS — Z1231 Encounter for screening mammogram for malignant neoplasm of breast: Secondary | ICD-10-CM | POA: Diagnosis not present

## 2014-08-29 DIAGNOSIS — Z853 Personal history of malignant neoplasm of breast: Secondary | ICD-10-CM | POA: Diagnosis not present

## 2014-09-01 DIAGNOSIS — L820 Inflamed seborrheic keratosis: Secondary | ICD-10-CM | POA: Diagnosis not present

## 2014-10-13 DIAGNOSIS — E1140 Type 2 diabetes mellitus with diabetic neuropathy, unspecified: Secondary | ICD-10-CM | POA: Diagnosis not present

## 2014-10-13 DIAGNOSIS — E785 Hyperlipidemia, unspecified: Secondary | ICD-10-CM | POA: Diagnosis not present

## 2014-10-13 DIAGNOSIS — Z0000 Encounter for general adult medical examination without abnormal findings: Secondary | ICD-10-CM | POA: Diagnosis not present

## 2014-10-13 DIAGNOSIS — R8290 Unspecified abnormal findings in urine: Secondary | ICD-10-CM | POA: Diagnosis not present

## 2014-10-13 DIAGNOSIS — N390 Urinary tract infection, site not specified: Secondary | ICD-10-CM | POA: Diagnosis not present

## 2014-10-17 DIAGNOSIS — K5792 Diverticulitis of intestine, part unspecified, without perforation or abscess without bleeding: Secondary | ICD-10-CM | POA: Diagnosis not present

## 2014-10-17 DIAGNOSIS — E119 Type 2 diabetes mellitus without complications: Secondary | ICD-10-CM | POA: Diagnosis not present

## 2014-10-17 DIAGNOSIS — R8290 Unspecified abnormal findings in urine: Secondary | ICD-10-CM | POA: Diagnosis not present

## 2014-10-17 DIAGNOSIS — N3001 Acute cystitis with hematuria: Secondary | ICD-10-CM | POA: Diagnosis not present

## 2014-10-20 DIAGNOSIS — R809 Proteinuria, unspecified: Secondary | ICD-10-CM | POA: Diagnosis not present

## 2014-10-20 DIAGNOSIS — E1140 Type 2 diabetes mellitus with diabetic neuropathy, unspecified: Secondary | ICD-10-CM | POA: Diagnosis not present

## 2014-10-20 DIAGNOSIS — G629 Polyneuropathy, unspecified: Secondary | ICD-10-CM | POA: Diagnosis not present

## 2014-10-20 DIAGNOSIS — Z0000 Encounter for general adult medical examination without abnormal findings: Secondary | ICD-10-CM | POA: Diagnosis not present

## 2014-10-20 DIAGNOSIS — E785 Hyperlipidemia, unspecified: Secondary | ICD-10-CM | POA: Diagnosis not present

## 2014-10-20 DIAGNOSIS — Z1389 Encounter for screening for other disorder: Secondary | ICD-10-CM | POA: Diagnosis not present

## 2014-10-20 DIAGNOSIS — E1129 Type 2 diabetes mellitus with other diabetic kidney complication: Secondary | ICD-10-CM | POA: Diagnosis not present

## 2014-10-20 DIAGNOSIS — I10 Essential (primary) hypertension: Secondary | ICD-10-CM | POA: Diagnosis not present

## 2014-10-30 DIAGNOSIS — H40012 Open angle with borderline findings, low risk, left eye: Secondary | ICD-10-CM | POA: Diagnosis not present

## 2014-10-30 DIAGNOSIS — H401113 Primary open-angle glaucoma, right eye, severe stage: Secondary | ICD-10-CM | POA: Diagnosis not present

## 2014-10-30 DIAGNOSIS — H353131 Nonexudative age-related macular degeneration, bilateral, early dry stage: Secondary | ICD-10-CM | POA: Diagnosis not present

## 2014-10-30 DIAGNOSIS — Z961 Presence of intraocular lens: Secondary | ICD-10-CM | POA: Diagnosis not present

## 2014-11-03 DIAGNOSIS — Z23 Encounter for immunization: Secondary | ICD-10-CM | POA: Diagnosis not present

## 2014-11-18 DIAGNOSIS — R0600 Dyspnea, unspecified: Secondary | ICD-10-CM | POA: Diagnosis not present

## 2014-11-18 DIAGNOSIS — Z6828 Body mass index (BMI) 28.0-28.9, adult: Secondary | ICD-10-CM | POA: Diagnosis not present

## 2014-11-18 DIAGNOSIS — R9431 Abnormal electrocardiogram [ECG] [EKG]: Secondary | ICD-10-CM | POA: Diagnosis not present

## 2014-11-18 DIAGNOSIS — R197 Diarrhea, unspecified: Secondary | ICD-10-CM | POA: Diagnosis not present

## 2014-12-09 DIAGNOSIS — K580 Irritable bowel syndrome with diarrhea: Secondary | ICD-10-CM | POA: Diagnosis not present

## 2014-12-09 DIAGNOSIS — Z800 Family history of malignant neoplasm of digestive organs: Secondary | ICD-10-CM | POA: Diagnosis not present

## 2015-02-05 DIAGNOSIS — Z1211 Encounter for screening for malignant neoplasm of colon: Secondary | ICD-10-CM | POA: Diagnosis not present

## 2015-04-21 DIAGNOSIS — H40011 Open angle with borderline findings, low risk, right eye: Secondary | ICD-10-CM | POA: Diagnosis not present

## 2015-04-21 DIAGNOSIS — Z961 Presence of intraocular lens: Secondary | ICD-10-CM | POA: Diagnosis not present

## 2015-04-21 DIAGNOSIS — H401123 Primary open-angle glaucoma, left eye, severe stage: Secondary | ICD-10-CM | POA: Diagnosis not present

## 2015-05-18 DIAGNOSIS — G6289 Other specified polyneuropathies: Secondary | ICD-10-CM | POA: Diagnosis not present

## 2015-05-18 DIAGNOSIS — R808 Other proteinuria: Secondary | ICD-10-CM | POA: Diagnosis not present

## 2015-05-18 DIAGNOSIS — H4089 Other specified glaucoma: Secondary | ICD-10-CM | POA: Diagnosis not present

## 2015-05-18 DIAGNOSIS — E1129 Type 2 diabetes mellitus with other diabetic kidney complication: Secondary | ICD-10-CM | POA: Diagnosis not present

## 2015-05-18 DIAGNOSIS — I10 Essential (primary) hypertension: Secondary | ICD-10-CM | POA: Diagnosis not present

## 2015-05-18 DIAGNOSIS — E784 Other hyperlipidemia: Secondary | ICD-10-CM | POA: Diagnosis not present

## 2015-05-18 DIAGNOSIS — K219 Gastro-esophageal reflux disease without esophagitis: Secondary | ICD-10-CM | POA: Diagnosis not present

## 2015-05-18 DIAGNOSIS — E1140 Type 2 diabetes mellitus with diabetic neuropathy, unspecified: Secondary | ICD-10-CM | POA: Diagnosis not present

## 2015-10-05 DIAGNOSIS — H40013 Open angle with borderline findings, low risk, bilateral: Secondary | ICD-10-CM | POA: Diagnosis not present

## 2015-10-13 DIAGNOSIS — Z23 Encounter for immunization: Secondary | ICD-10-CM | POA: Diagnosis not present

## 2015-12-14 DIAGNOSIS — E785 Hyperlipidemia, unspecified: Secondary | ICD-10-CM | POA: Diagnosis not present

## 2015-12-14 DIAGNOSIS — I10 Essential (primary) hypertension: Secondary | ICD-10-CM | POA: Diagnosis not present

## 2015-12-14 DIAGNOSIS — E119 Type 2 diabetes mellitus without complications: Secondary | ICD-10-CM | POA: Diagnosis not present

## 2016-01-22 DIAGNOSIS — H3530 Unspecified macular degeneration: Secondary | ICD-10-CM | POA: Diagnosis not present

## 2016-01-22 DIAGNOSIS — I10 Essential (primary) hypertension: Secondary | ICD-10-CM | POA: Diagnosis not present

## 2016-01-22 DIAGNOSIS — Z13820 Encounter for screening for osteoporosis: Secondary | ICD-10-CM | POA: Diagnosis not present

## 2016-01-22 DIAGNOSIS — Z114 Encounter for screening for human immunodeficiency virus [HIV]: Secondary | ICD-10-CM | POA: Diagnosis not present

## 2016-01-22 DIAGNOSIS — E119 Type 2 diabetes mellitus without complications: Secondary | ICD-10-CM | POA: Diagnosis not present

## 2016-01-22 DIAGNOSIS — R001 Bradycardia, unspecified: Secondary | ICD-10-CM | POA: Diagnosis not present

## 2016-01-22 DIAGNOSIS — Z0000 Encounter for general adult medical examination without abnormal findings: Secondary | ICD-10-CM | POA: Diagnosis not present

## 2016-01-22 DIAGNOSIS — E785 Hyperlipidemia, unspecified: Secondary | ICD-10-CM | POA: Diagnosis not present

## 2016-01-28 DIAGNOSIS — E2839 Other primary ovarian failure: Secondary | ICD-10-CM | POA: Diagnosis not present

## 2016-04-18 DIAGNOSIS — H401123 Primary open-angle glaucoma, left eye, severe stage: Secondary | ICD-10-CM | POA: Diagnosis not present

## 2016-04-18 DIAGNOSIS — H3530 Unspecified macular degeneration: Secondary | ICD-10-CM | POA: Diagnosis not present

## 2016-04-18 DIAGNOSIS — Z961 Presence of intraocular lens: Secondary | ICD-10-CM | POA: Diagnosis not present

## 2016-04-19 DIAGNOSIS — J069 Acute upper respiratory infection, unspecified: Secondary | ICD-10-CM | POA: Diagnosis not present

## 2016-04-19 DIAGNOSIS — Z6839 Body mass index (BMI) 39.0-39.9, adult: Secondary | ICD-10-CM | POA: Diagnosis not present

## 2016-05-13 DIAGNOSIS — R079 Chest pain, unspecified: Secondary | ICD-10-CM | POA: Diagnosis not present

## 2016-05-13 DIAGNOSIS — R0789 Other chest pain: Secondary | ICD-10-CM | POA: Diagnosis not present

## 2016-05-13 DIAGNOSIS — I10 Essential (primary) hypertension: Secondary | ICD-10-CM | POA: Diagnosis not present

## 2016-05-13 DIAGNOSIS — E119 Type 2 diabetes mellitus without complications: Secondary | ICD-10-CM | POA: Diagnosis not present

## 2016-05-14 DIAGNOSIS — R079 Chest pain, unspecified: Secondary | ICD-10-CM | POA: Diagnosis not present

## 2016-05-14 DIAGNOSIS — E119 Type 2 diabetes mellitus without complications: Secondary | ICD-10-CM | POA: Diagnosis not present

## 2016-05-14 DIAGNOSIS — I10 Essential (primary) hypertension: Secondary | ICD-10-CM | POA: Diagnosis not present

## 2016-05-27 DIAGNOSIS — H401122 Primary open-angle glaucoma, left eye, moderate stage: Secondary | ICD-10-CM | POA: Diagnosis not present

## 2016-05-27 DIAGNOSIS — H40011 Open angle with borderline findings, low risk, right eye: Secondary | ICD-10-CM | POA: Diagnosis not present

## 2016-08-10 DIAGNOSIS — I10 Essential (primary) hypertension: Secondary | ICD-10-CM | POA: Diagnosis not present

## 2016-08-10 DIAGNOSIS — H6123 Impacted cerumen, bilateral: Secondary | ICD-10-CM | POA: Diagnosis not present

## 2016-08-10 DIAGNOSIS — E119 Type 2 diabetes mellitus without complications: Secondary | ICD-10-CM | POA: Diagnosis not present

## 2016-09-02 DIAGNOSIS — H40011 Open angle with borderline findings, low risk, right eye: Secondary | ICD-10-CM | POA: Diagnosis not present

## 2016-09-02 DIAGNOSIS — H401122 Primary open-angle glaucoma, left eye, moderate stage: Secondary | ICD-10-CM | POA: Diagnosis not present

## 2016-09-02 DIAGNOSIS — H401121 Primary open-angle glaucoma, left eye, mild stage: Secondary | ICD-10-CM | POA: Diagnosis not present

## 2016-09-09 DIAGNOSIS — M25562 Pain in left knee: Secondary | ICD-10-CM | POA: Diagnosis not present

## 2016-09-09 DIAGNOSIS — M25511 Pain in right shoulder: Secondary | ICD-10-CM | POA: Diagnosis not present

## 2016-09-09 DIAGNOSIS — E119 Type 2 diabetes mellitus without complications: Secondary | ICD-10-CM | POA: Diagnosis not present

## 2016-09-09 DIAGNOSIS — M170 Bilateral primary osteoarthritis of knee: Secondary | ICD-10-CM | POA: Diagnosis not present

## 2016-09-09 DIAGNOSIS — I10 Essential (primary) hypertension: Secondary | ICD-10-CM | POA: Diagnosis not present

## 2016-10-07 DIAGNOSIS — Z23 Encounter for immunization: Secondary | ICD-10-CM | POA: Diagnosis not present

## 2016-11-10 DIAGNOSIS — E785 Hyperlipidemia, unspecified: Secondary | ICD-10-CM | POA: Diagnosis not present

## 2016-11-10 DIAGNOSIS — E119 Type 2 diabetes mellitus without complications: Secondary | ICD-10-CM | POA: Diagnosis not present

## 2016-11-10 DIAGNOSIS — I10 Essential (primary) hypertension: Secondary | ICD-10-CM | POA: Diagnosis not present

## 2016-11-10 DIAGNOSIS — Z79899 Other long term (current) drug therapy: Secondary | ICD-10-CM | POA: Diagnosis not present

## 2016-12-19 DIAGNOSIS — E785 Hyperlipidemia, unspecified: Secondary | ICD-10-CM | POA: Diagnosis not present

## 2016-12-19 DIAGNOSIS — E119 Type 2 diabetes mellitus without complications: Secondary | ICD-10-CM | POA: Diagnosis not present

## 2018-01-30 ENCOUNTER — Telehealth: Payer: Self-pay | Admitting: *Deleted

## 2018-01-30 NOTE — Telephone Encounter (Signed)
Medical records faxed to [facility]; RI# [phone]

## 2022-06-14 ENCOUNTER — Telehealth: Payer: Self-pay

## 2022-06-14 NOTE — Telephone Encounter (Signed)
Entry error
